# Patient Record
Sex: Male | Born: 2005 | Race: Black or African American | Hispanic: No | Marital: Single | Smoking: Never smoker
Health system: Southern US, Community
[De-identification: ages and names within clinical notes are randomized; demographics above are authoritative.]

---
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
## 2024-10-30 ENCOUNTER — Other Ambulatory Visit: Payer: Self-pay

## 2024-10-30 ENCOUNTER — Emergency Department (HOSPITAL_COMMUNITY)
Admission: EM | Admit: 2024-10-30 | Discharge: 2024-10-30 | Discharge status: Against Medical Advice | Attending: Emergency Medicine | Admitting: Emergency Medicine

## 2024-10-30 DIAGNOSIS — K0889 Other specified disorders of teeth and supporting structures: Secondary | ICD-10-CM | POA: Diagnosis present

## 2024-10-30 DIAGNOSIS — Z5321 Procedure and treatment not carried out due to patient leaving prior to being seen by health care provider: Secondary | ICD-10-CM | POA: Diagnosis not present

## 2024-10-30 MED ORDER — OXYCODONE-ACETAMINOPHEN 5-325 MG PO TABS
1.0000 | ORAL_TABLET | ORAL | Status: DC | PRN
  Filled 2024-10-30: qty 1

## 2024-10-30 MED ORDER — OXYCODONE-ACETAMINOPHEN 5-325 MG PO TABS
1.0000 | ORAL_TABLET | Freq: Once | ORAL | Status: AC
  Administered 2024-10-30: 02:00:00 1 via ORAL

## 2024-10-30 NOTE — ED Triage Notes (Signed)
 Pt arrives POV with complaints of right sided dental pain that started a couple months ago. Pain worsened today. Took tylenol  without relief.
